# Patient Record
Sex: Male | Born: 1995 | Race: Black or African American | Hispanic: No | Marital: Single | Smoking: Never smoker
Health system: Southern US, Community
[De-identification: ages and names within clinical notes are randomized; demographics above are authoritative.]

---

## 2015-07-11 ENCOUNTER — Encounter (HOSPITAL_COMMUNITY): Payer: Self-pay | Admitting: Emergency Medicine

## 2015-07-11 ENCOUNTER — Emergency Department (HOSPITAL_COMMUNITY): Payer: Medicaid Other

## 2015-07-11 ENCOUNTER — Emergency Department (HOSPITAL_COMMUNITY)
Admission: EM | Admit: 2015-07-11 | Discharge: 2015-07-11 | Disposition: A | Discharge status: Home / Self Care | Payer: Medicaid Other | Attending: Emergency Medicine | Admitting: Emergency Medicine

## 2015-07-11 DIAGNOSIS — R079 Chest pain, unspecified: Secondary | ICD-10-CM | POA: Diagnosis present

## 2015-07-11 DIAGNOSIS — R002 Palpitations: Secondary | ICD-10-CM | POA: Diagnosis not present

## 2015-07-11 LAB — BASIC METABOLIC PANEL
Anion gap: 9 (ref 5–15)
BUN: 6 mg/dL (ref 6–20)
CO2: 24 mmol/L (ref 22–32)
Calcium: 9.5 mg/dL (ref 8.9–10.3)
Chloride: 104 mmol/L (ref 101–111)
Creatinine, Ser: 0.95 mg/dL (ref 0.61–1.24)
GFR calc Af Amer: >60 mL/min (ref >60)
GFR calc non Af Amer: >60 mL/min (ref >60)
Glucose, Bld: 107 mg/dL — ABNORMAL HIGH (ref 65–99)
Potassium: 4 mmol/L (ref 3.5–5.1)
SODIUM: 137 mmol/L (ref 135–145)

## 2015-07-11 LAB — CBC
HCT: 44.2 % (ref 39.0–52.0)
Hemoglobin: 15.4 g/dL (ref 13.0–17.0)
MCH: 29.8 pg (ref 26.0–34.0)
MCHC: 34.8 g/dL (ref 30.0–36.0)
MCV: 85.5 fL (ref 78.0–100.0)
PLATELETS: 164 10*3/uL (ref 150–400)
RBC: 5.17 MIL/uL (ref 4.22–5.81)
RDW: 12.9 % (ref 11.5–15.5)
WBC: 3.4 10*3/uL — AB (ref 4.0–10.5)

## 2015-07-11 LAB — I-STAT TROPONIN, ED: TROPONIN I, POC: 0 ng/mL (ref 0.00–0.08)

## 2015-07-11 MED ORDER — NAPROXEN 250 MG PO TABS: 250.0000 mg | ORAL_TABLET | Freq: Two times a day (BID) | ORAL | Status: DC | PRN

## 2015-07-11 NOTE — ED Notes (Signed)
Brought patient back to room; patient undressed, in gown, on monitor, continuous pulse oximetry and blood pressure cuff 

## 2015-07-11 NOTE — ED Provider Notes (Signed)
CSN: [id number]     Arrival date & time 07/11/15  1048 History   First MD Initiated Contact with Patient 07/11/15 1504     Chief Complaint  Patient presents with  . Chest Pain     (Consider location/radiation/quality/duration/timing/severity/associated sxs/prior Treatment) Patient is a 20 y.o. male presenting with palpitations. The history is provided by the patient. A language interpreter was used (swahili).  Palpitations Palpitations quality:  Fast Onset quality:  Sudden Duration:  6 minutes Timing:  Rare (once) Progression:  Resolved Chronicity:  New Context: dehydration   Relieved by: hydration. Worsened by:  Nothing Ineffective treatments:  None tried Associated symptoms: no chest pain, no chest pressure, no cough, no diaphoresis, no dizziness, no lower extremity edema, no nausea, no near-syncope, no shortness of breath and no syncope   Risk factors: no diabetes mellitus, no heart disease, no hx of atrial fibrillation, no hx of PE, no hypercoagulable state, no hyperthyroidism and no OTC sinus medications     History reviewed. No pertinent past medical history. History reviewed. No pertinent past surgical history. No family history on file. Social History  Substance Use Topics  . Smoking status: Never Smoker   . Smokeless tobacco: None  . Alcohol Use: Yes     Comment: socially    Review of Systems  Constitutional: Negative for chills, diaphoresis and appetite change.  HENT: Negative for congestion, ear pain, facial swelling, mouth sores and sore throat.   Eyes: Negative for visual disturbance.  Respiratory: Negative for cough, chest tightness and shortness of breath.   Cardiovascular: Positive for palpitations. Negative for chest pain, syncope and near-syncope.  Gastrointestinal: Negative for nausea, diarrhea and blood in stool.  Endocrine: Negative for cold intolerance and heat intolerance.  Genitourinary: Negative for frequency, decreased urine volume and difficulty  urinating.  Musculoskeletal: Negative for neck stiffness.  Skin: Negative for rash.  Neurological: Negative for dizziness and light-headedness.  All other systems reviewed and are negative.     Allergies  Review of patient's allergies indicates no known allergies.  Home Medications   Prior to Admission medications   Medication Sig Start Date End Date Taking? Authorizing Provider  naproxen (NAPROSYN) 250 MG tablet Take 1 tablet (250 mg total) by mouth 2 (two) times daily as needed for moderate pain. 07/11/15   Drema Pry, MD   BP 124/83 mmHg  Pulse 56  Temp(Src) 98.2 F (36.8 C) (Oral)  Resp 18  SpO2 100% Physical Exam  Constitutional: He is oriented to person, place, and time. He appears well-nourished. No distress.  HENT:  Head: Normocephalic and atraumatic.  Right Ear: External ear normal.  Left Ear: External ear normal.  Eyes: Pupils are equal, round, and reactive to light. Right eye exhibits no discharge. Left eye exhibits no discharge. No scleral icterus.  Neck: Normal range of motion. Neck supple.  Cardiovascular: Normal rate.  Exam reveals no gallop and no friction rub.   No murmur heard. Pulmonary/Chest: Effort normal and breath sounds normal. No stridor. No respiratory distress. He has no wheezes. He has no rales. He exhibits no tenderness.  Abdominal: Soft. He exhibits no distension and no mass. There is no tenderness. There is no rebound and no guarding.  Musculoskeletal: He exhibits no edema or tenderness.  Neurological: He is alert and oriented to person, place, and time.  Skin: Skin is warm and dry. No rash noted. He is not diaphoretic. No erythema.    ED Course  Procedures (including critical care time) Labs Review Labs Reviewed  BASIC METABOLIC PANEL - Abnormal; Notable for the following:    Glucose, Bld 107 (*)    All other components within normal limits  CBC - Abnormal; Notable for the following:    WBC 3.4 (*)    All other components within  normal limits  Rosezena Sensor, ED    Imaging Review Dg Chest 2 View  07/11/2015  CLINICAL DATA:  Chest pain since yesterday EXAM: CHEST  2 VIEW COMPARISON:  None. FINDINGS: The heart size and mediastinal contours are within normal limits. Both lungs are clear. The visualized skeletal structures are unremarkable. IMPRESSION: No active cardiopulmonary disease. Electronically Signed   By: Elige Ko   On: 07/11/2015 11:40   I have personally reviewed and evaluated these images and lab results as part of my medical decision-making.   EKG Interpretation   Date/Time:  Wednesday July 11 2015 11:01:14 EST Ventricular Rate:  53 PR Interval:  166 QRS Duration: 106 QT Interval:  382 QTC Calculation: 358 R Axis:   74 Text Interpretation:  Sinus bradycardia with sinus arrhythmia Otherwise  normal ECG No previous tracing Confirmed by Anitra Lauth  MD, Alphonzo Lemmings ([id number])  on 07/11/2015 3:06:05 PM      MDM   20 year old male who recently came to the [place] from [place] presents to the ED for work. Patient reports he had palpitations that lasted 6 minutes yesterday while at work and was seen by the work nurse who recommended seen a physician. She denied any chest pain, shortness of breat, dizziness, or nausea/vomiting during the episode. He denied any recent illnesses or infections. No personal or family history of cardiac problems.  No other physical complaints at this time. Rest of the history as above. EKG with sinus bradycardia and sinus arrhythmia. Normal intervals and axis. No evidence of Brugada. No ischemic changes. Normal variant for his age. Chest x-ray unremarkable. Labs unremarkable.   Given the patient's history this is likely secondary to dehydration hasn't resolved with fluids. No indication for additional workup at this time.  Patient did report that he tried calling his PCP however he does not understand and was fairly well. We spoke with our social worker who will assist the patient in  obtaining his first PCP appointment.  Patient is safe for discharge with strict return precautions. Patient is to follow-up with his PCP as needed.  Patient was seen in conjunction with Dr. Anitra Lauth.  Final diagnoses:  Palpitations        Drema Pry, MD 07/11/15 6045  Gwyneth Sprout, MD 07/12/15 [phone]

## 2015-07-11 NOTE — Care Management (Addendum)
ED CM consulted by Dr. Leonette Monarch to assist with establishing Primary Care. CM went to met with patient.  However patient was discharged from room. Patient speaks Swahili. CM contacted [employer] line spoke with Mliss Sax ID [phone] for interpreting, via conference call appointment was scheduled at the [facility] on Thurs 2/23 at [place]. Patient was agreeable. Address and phone number of clinic  verified in [place]. Consent verbally obtained to text clinic information in [place] he states,  he will will use [employer] to Translate. Sick note was also provided for this visit, patient stated via interpreter that he received a sick note prior to discharge. No further question or concerns noted.

## 2015-07-11 NOTE — Discharge Instructions (Signed)
Palpitations A palpitation is the feeling that your heartbeat is irregular. It may feel like your heart is fluttering or skipping a beat. It may also feel like your heart is beating faster than normal. This is usually not a serious problem. In some cases, you may need more medical tests. HOME CARE  Avoid:  Caffeine in coffee, tea, soft drinks, diet pills, and energy drinks.  Chocolate.  Alcohol.  Stop smoking if you smoke.  Reduce your stress and anxiety. Try:  A method that measures bodily functions so you can learn to control them (biofeedback).  Yoga.  Meditation.  Physical activity such as swimming, jogging, or walking.  Get plenty of rest and sleep. GET HELP IF:  Your fast or irregular heartbeat continues after 24 hours.  Your palpitations occur more often. GET HELP RIGHT AWAY IF:   You have chest pain.  You feel short of breath.  You have a very bad headache.  You feel dizzy or pass out (faint). MAKE SURE YOU:   Understand these instructions.  Will watch your condition.  Will get help right away if you are not doing well or get worse.   This information is not intended to replace advice given to you by your health care provider. Make sure you discuss any questions you have with your health care provider.   Document Released: 02/19/2008 Document Revised: 06/02/2014 Document Reviewed: 07/11/2011 Elsevier Interactive Patient Education 2016 Elsevier Inc.  

## 2015-07-11 NOTE — ED Notes (Signed)
Patient states started having chest pain yesterday, but has resolved at this time.   Patient states he has to have a note to return to work.  Patient states he went to primary doctor yesterday, but cannot tell me the name of the doctor.  Language barrier:  I used a Designer, jewellery.   The translator had a problem understanding patient.

## 2015-07-17 ENCOUNTER — Inpatient Hospital Stay: Payer: Medicaid Other | Admitting: Family Medicine

## 2015-07-19 ENCOUNTER — Ambulatory Visit: Payer: Medicaid Other | Attending: Family Medicine | Admitting: Family Medicine

## 2015-07-19 ENCOUNTER — Encounter: Payer: Self-pay | Admitting: Family Medicine

## 2015-07-19 VITALS — BP 118/72 | HR 50 | Temp 97.8°F | Resp 15 | Ht 66.5 in | Wt 170.0 lb

## 2015-07-19 DIAGNOSIS — R079 Chest pain, unspecified: Secondary | ICD-10-CM | POA: Insufficient documentation

## 2015-07-19 DIAGNOSIS — Z6825 Body mass index (BMI) 25.0-25.9, adult: Secondary | ICD-10-CM | POA: Insufficient documentation

## 2015-07-19 DIAGNOSIS — E663 Overweight: Secondary | ICD-10-CM | POA: Insufficient documentation

## 2015-07-19 DIAGNOSIS — Z131 Encounter for screening for diabetes mellitus: Secondary | ICD-10-CM | POA: Diagnosis not present

## 2015-07-19 DIAGNOSIS — M545 Low back pain, unspecified: Secondary | ICD-10-CM | POA: Insufficient documentation

## 2015-07-19 DIAGNOSIS — Z79899 Other long term (current) drug therapy: Secondary | ICD-10-CM | POA: Diagnosis not present

## 2015-07-19 LAB — POCT GLYCOSYLATED HEMOGLOBIN (HGB A1C): Hemoglobin A1C: 5.1

## 2015-07-19 MED ORDER — NAPROXEN 250 MG PO TABS: 250.0000 mg | ORAL_TABLET | Freq: Two times a day (BID) | ORAL | Status: DC | PRN

## 2015-07-19 NOTE — Patient Instructions (Signed)

## 2015-07-19 NOTE — Progress Notes (Signed)
Subjective:  Patient ID: Larry Rich, male    DOB: 09-05-1995  Age: 20 y.o. MRN: [id number]  CC: Follow-up   HPI Larry Rich presents for follow-up from the ED where he had presented with chest pains and palpitations. He was seen on 07/11/15 at [facility] ED; cardiac markers were negative, EKG , chest x-ray were unremarkable. Symptoms were thought to be secondary to dehydration and he was subsequently discharged to follow-up with the clinic here.  Since his ED visit he has not had any repeat symptoms however he does complain of occasional low back pain which he describes as moderate and is exacerbated by bending with a duration of 3 months and mostly occurs at his job where he has to do some lifting; pain is absent at this time and he has not used any OTC analgesics.  Outpatient Prescriptions Prior to Visit  Medication Sig Dispense Refill  . naproxen (NAPROSYN) 250 MG tablet Take 1 tablet (250 mg total) by mouth 2 (two) times daily as needed for moderate pain. (Patient not taking: Reported on 07/19/2015)     No facility-administered medications prior to visit.    ROS Review of Systems  Constitutional: Negative for activity change and appetite change.  HENT: Negative for sinus pressure and sore throat.   Eyes: Negative for visual disturbance.  Respiratory: Negative for cough, chest tightness and shortness of breath.   Cardiovascular: Negative for chest pain and leg swelling.  Gastrointestinal: Negative for abdominal pain, diarrhea, constipation and abdominal distention.  Endocrine: Negative.   Genitourinary: Negative for dysuria.  Musculoskeletal: Negative for myalgias and joint swelling.  Skin: Negative for rash.  Allergic/Immunologic: Negative.   Neurological: Negative for weakness, light-headedness and numbness.  Psychiatric/Behavioral: Negative for suicidal ideas and dysphoric mood.    Objective:  BP 118/72 mmHg  Pulse 50  Temp(Src) 97.8 F (36.6 C)  Resp 15   Ht 5' 6.5" (1.689 m)  Wt 170 lb (77.111 kg)  BMI 27.03 kg/m2  SpO2 98%  BP/Weight 07/19/2015 07/11/2015  Systolic BP 118 128  Diastolic BP 72 91  Wt. (Lbs) 170 -  BMI 27.03 -      Physical Exam  Constitutional: He is oriented to person, place, and time. He appears well-developed and well-nourished.  Cardiovascular: Normal heart sounds and intact distal pulses.  Bradycardia present.   No murmur heard. Pulmonary/Chest: Effort normal and breath sounds normal. He has no wheezes. He has no rales. He exhibits no tenderness.  Abdominal: Soft. Bowel sounds are normal. He exhibits no distension and no mass. There is no tenderness.  Musculoskeletal: Normal range of motion.  Neurological: He is alert and oriented to person, place, and time.  Skin: Skin is warm and dry.  Psychiatric: He has a normal mood and affect.     Assessment & Plan:   1. Diabetes mellitus screening A1c 5.1 - HgB A1c  2. Overweight (BMI 25.0-29.9) Advised to increase physical activity and reduce portion sizes  3. Midline low back pain without sciatica Demonstrated stretching exercises to the patient - naproxen (NAPROSYN) 250 MG tablet; Take 1 tablet (250 mg total) by mouth 2 (two) times daily as needed for moderate pain.  Dispense: 60 tablet; Refill: 1   Meds ordered this encounter  Medications  . naproxen (NAPROSYN) 250 MG tablet    Sig: Take 1 tablet (250 mg total) by mouth 2 (two) times daily as needed for moderate pain.    Dispense:  60 tablet    Refill:  1  Follow-up: Return in about 3 months (around 10/16/2015) for follow up of back pain.   Jaclyn Shaggy MD

## 2015-07-19 NOTE — Progress Notes (Signed)
Patient here to establish care and follow up from ED for palpitations He reports no complaints today and says symptoms have resolved

## 2016-02-13 ENCOUNTER — Ambulatory Visit: Payer: BLUE CROSS/BLUE SHIELD | Attending: Family Medicine | Admitting: Family Medicine

## 2016-02-13 ENCOUNTER — Encounter: Payer: Self-pay | Admitting: Family Medicine

## 2016-02-13 VITALS — BP 130/76 | HR 51 | Temp 97.7°F | Wt 160.6 lb

## 2016-02-13 DIAGNOSIS — M791 Myalgia, unspecified site: Secondary | ICD-10-CM

## 2016-02-13 DIAGNOSIS — Z114 Encounter for screening for human immunodeficiency virus [HIV]: Secondary | ICD-10-CM

## 2016-02-13 DIAGNOSIS — M545 Low back pain, unspecified: Secondary | ICD-10-CM

## 2016-02-13 LAB — HIV ANTIBODY (ROUTINE TESTING W REFLEX): HIV 1&2 Ab, 4th Generation: NONREACTIVE

## 2016-02-13 MED ORDER — NAPROXEN 250 MG PO TABS: 250.0000 mg | ORAL_TABLET | Freq: Two times a day (BID) | ORAL | 2 refills | Status: DC | PRN

## 2016-02-13 NOTE — Progress Notes (Signed)
Pt states he is here for the following:  Medication refill - Naproxen  Screening for HIV   Pt states he has been having fatigue and malaise for the past month. Pt states he has not been taking any multi vitamins and has been working a lot.

## 2016-02-13 NOTE — Progress Notes (Signed)
Subjective:  Patient ID: Larry Rich, male    DOB: [birth year]  Age: 20 y.o. MRN: [id number]  CC: Follow-up (General check up)   HPI Cristal Magid is a 27 -year-old Swahili speaking male who presents today accompanied by a Swahili interpreter and is requesting a refill of naproxen. He has body aches which are intermittent and low back pain which are related to his job. He works for [employer] and working hours are between 3 PM and 3 AM; his drive to and from work is a total of 3 hours.  Back pain does not radiate down his extremities and is relieved by taking naproxen; would like to be screened for HIV.  No past medical history on file.  No past surgical history on file.  No Known Allergies   Outpatient Medications Prior to Visit  Medication Sig Dispense Refill  . naproxen (NAPROSYN) 250 MG tablet Take 1 tablet (250 mg total) by mouth 2 (two) times daily as needed for moderate pain. (Patient not taking: Reported on 02/13/2016) 60 tablet 1   No facility-administered medications prior to visit.     ROS Review of Systems  Constitutional: Positive for fatigue. Negative for activity change and appetite change.  HENT: Negative for sinus pressure and sore throat.   Eyes: Negative for visual disturbance.  Respiratory: Negative for cough, chest tightness and shortness of breath.   Cardiovascular: Negative for chest pain and leg swelling.  Gastrointestinal: Negative for abdominal distention, abdominal pain, constipation and diarrhea.  Endocrine: Negative.   Genitourinary: Negative for dysuria.  Musculoskeletal: Positive for back pain and myalgias. Negative for joint swelling.  Skin: Negative for rash.  Allergic/Immunologic: Negative.   Neurological: Negative for weakness, light-headedness and numbness.  Psychiatric/Behavioral: Negative for dysphoric mood and suicidal ideas.    Objective:  BP 130/76 (BP Location: Right Arm, Patient Position: Sitting, Cuff Size: Large)   Pulse  (!) 51   Temp 97.7 F (36.5 C) (Oral)   Wt 160 lb 9.6 oz (72.8 kg)   BMI 25.53 kg/m   BP/Weight 02/13/2016 07/19/2015 07/11/2015  Systolic BP 130 118 128  Diastolic BP 76 72 91  Wt. (Lbs) 160.6 170 -  BMI 25.53 27.03 -      Physical Exam  Constitutional: He is oriented to person, place, and time. He appears well-developed and well-nourished.  Cardiovascular: Normal rate, normal heart sounds and intact distal pulses.   No murmur heard. Pulmonary/Chest: Effort normal and breath sounds normal. He has no wheezes. He has no rales. He exhibits no tenderness.  Abdominal: Soft. Bowel sounds are normal. He exhibits no distension and no mass. There is no tenderness.  Musculoskeletal: Normal range of motion.  Neurological: He is alert and oriented to person, place, and time.     Assessment & Plan:   1. Encounter for special screening examination for HIV - HIV antibody (with reflex)  2. Midline low back pain without sciatica Advised on proper lifting techniques - naproxen (NAPROSYN) 250 MG tablet; Take 1 tablet (250 mg total) by mouth 2 (two) times daily as needed for moderate pain.  Dispense: 60 tablet; Refill: 2  3. 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 naproxen (NAPROSYN) 250 MG tablet    Sig: Take 1 tablet (250 mg total) by mouth 2 (two) times daily as needed for moderate pain.    Dispense:  60 tablet  Refill:  2    Follow-up: Return in about 6 months (around 08/12/2016) for coordination of care.   Jaclyn ShaggyEnobong Amao MD

## 2016-02-14 ENCOUNTER — Telehealth: Payer: Self-pay

## 2016-02-14 NOTE — Telephone Encounter (Signed)
-----   Message from Jaclyn ShaggyEnobong Amao, MD sent at 02/14/2016  9:12 AM EDT ----- Please inform the patient that labs are normal. Thank you.

## 2016-02-14 NOTE — Telephone Encounter (Signed)
Clld pt  Used [employer] [place] ID# [id number]  Advsd pt of results. Pt stated he understood.

## 2016-11-07 ENCOUNTER — Encounter (HOSPITAL_COMMUNITY): Payer: Self-pay | Admitting: Nurse Practitioner

## 2016-11-07 ENCOUNTER — Emergency Department (HOSPITAL_COMMUNITY)
Admission: EM | Admit: 2016-11-07 | Discharge: 2016-11-07 | Disposition: A | Discharge status: Home / Self Care | Payer: BLUE CROSS/BLUE SHIELD | Attending: Emergency Medicine | Admitting: Emergency Medicine

## 2016-11-07 DIAGNOSIS — I10 Essential (primary) hypertension: Secondary | ICD-10-CM | POA: Diagnosis present

## 2016-11-07 DIAGNOSIS — Z0130 Encounter for examination of blood pressure without abnormal findings: Secondary | ICD-10-CM

## 2016-11-07 NOTE — ED Provider Notes (Signed)
[facility]-EMERGENCY DEPT Provider Note   CSN: [id number] Arrival date & time: 11/07/16  1624  By signing my name below, I, Modena JanskyAlbert Thayil, attest that this documentation has been prepared under the direction and in the presence of non-physician practitioner, Kerrie BuffaloHope Fronie Holstein, NP. Electronically Signed: Modena JanskyAlbert Thayil, Scribe. 11/07/2016. 5:51 PM.  History   Chief Complaint Chief Complaint  Patient presents with  . Hypertension   The history is provided by the patient. No language interpreter was used.  Hypertension  This is a new problem. The current episode started yesterday. The problem occurs daily. The problem has not changed since onset.Pertinent negatives include no chest pain, no abdominal pain and no shortness of breath. Nothing aggravates the symptoms. Nothing relieves the symptoms. He has tried nothing for the symptoms.   HPI Comments: Larry Rich is a 21 y.o. male who presents to the Emergency Department complaining of hypertension that started yesterday. He states he had a high blood pressure reading  at his work place and instructed to get a note clearing him to resume his job. His BP in the ED today was 150/89. No modifying factors or associated symptoms. Denies any known prior hx of HTN or other complaints at this time. Patient reports that he went to his PCP office but he is no longer there. Patient has to have a note to return to work so he came to the ED.   History reviewed. No pertinent past medical history.  Patient Active Problem List   Diagnosis Date Noted  . Myalgia 02/13/2016  . Overweight (BMI 25.0-29.9) 07/19/2015  . Low back pain 07/19/2015    History reviewed. No pertinent surgical history.     Home Medications    Prior to Admission medications   Not on File    Family History History reviewed. No pertinent family history.  Social History Social History  Substance Use Topics  . Smoking status: Never Smoker  . Smokeless tobacco: Never Used  . Alcohol  use 1.8 oz/week    3 Cans of beer per week     Comment: socially     Allergies   Patient has no known allergies.   Review of Systems Review of Systems  Constitutional: Negative for fever.  HENT: Negative.   Eyes: Negative for visual disturbance.  Respiratory: Negative for shortness of breath.   Cardiovascular: Negative for chest pain.       +HTN  Gastrointestinal: Negative for abdominal pain.  Musculoskeletal: Negative for arthralgias.  Skin: Negative for rash.     Physical Exam Updated Vital Signs BP (!) 150/89   Pulse 79   Temp 97.8 F (36.6 C) (Oral)   Resp 18   SpO2 100%    Physical Exam  Constitutional: He appears well-developed and well-nourished. No distress.  HENT:  Head: Normocephalic and atraumatic.  Eyes: EOM are normal.  Neck: Neck supple.  Cardiovascular: Normal rate and regular rhythm.   Pulmonary/Chest: Effort normal. No respiratory distress. He has no wheezes. He has no rales.  Musculoskeletal: Normal range of motion.  Neurological: He is alert.  Skin: Skin is warm and dry.  Psychiatric: He has a normal mood and affect.  Nursing note and vitals reviewed.    ED Treatments / Results  DIAGNOSTIC STUDIES: Oxygen Saturation is 100% on RA, normal by my interpretation.    COORDINATION OF CARE: 5:55 PM- Pt advised of plan for treatment and pt agrees.  Labs (all labs ordered are listed, but only abnormal results are displayed) Labs Reviewed - No  data to display   Radiology No results found.  Procedures Procedures (including critical care time)  Medications Ordered in ED Medications - No data to display   Initial Impression / Assessment and Plan / ED Course  I have reviewed the triage vital signs and the nursing notes. Patient here for work note due to being hypertensive at work.  No signs of hypertensive urgency.  Discussed with patient the need for close follow-up and management by a primary care physician. Case Manager in to discuss  with the patient f/u. [facility] for follow up of his BP. Blood pressure stable at d/c. Will not start medication.  BP 132/86 (BP Location: Right Arm)   Pulse 65   Temp 97.8 F (36.6 C) (Oral)   Resp 18   SpO2 100%   Final Clinical Impressions(s) / ED Diagnoses   Final diagnoses:  Blood pressure check    New Prescriptions Discharge Medication List as of 11/07/2016  8:08 PM    I personally performed the services described in this documentation, which was scribed in my presence. The recorded information has been reviewed and is accurate.    Kerrie Buffalo [place], [place] 11/08/16 0147    Nira Conn, MD 11/08/16 [phone]

## 2016-11-07 NOTE — ED Triage Notes (Signed)
Pt presents with c/o high blood pressure. He started new job yesterday and they checked his blood pressure and found it was elevated. He was told he can not return to work without a doctors note regarding his blood pressure. He denies any complaints. He denies any history of hypertension. He speaks french/swahili.

## 2016-11-07 NOTE — Care Management (Signed)
ED CM met with patient at bedside to discuss ED folllow up offered patient to contact a [facility] provider, patient prefers a [facility] provider. Discussed [facility] for primary care needs. CM provided contact information for patient to call Monday 6/18 after 9am to schedule follow up appointment, verbalized understanding teach back done. No further ED CM needs identified

## 2016-11-07 NOTE — Discharge Instructions (Signed)
Follow up with [facility], not because you have [facility] but for your blood pressure monitoring.

## 2016-12-27 ENCOUNTER — Emergency Department (HOSPITAL_COMMUNITY)
Admission: EM | Admit: 2016-12-27 | Discharge: 2016-12-27 | Disposition: A | Discharge status: Home / Self Care | Payer: BLUE CROSS/BLUE SHIELD | Attending: Emergency Medicine | Admitting: Emergency Medicine

## 2016-12-27 ENCOUNTER — Emergency Department (HOSPITAL_COMMUNITY): Payer: BLUE CROSS/BLUE SHIELD

## 2016-12-27 ENCOUNTER — Encounter (HOSPITAL_COMMUNITY): Payer: Self-pay | Admitting: Emergency Medicine

## 2016-12-27 DIAGNOSIS — S51812A Laceration without foreign body of left forearm, initial encounter: Secondary | ICD-10-CM | POA: Diagnosis not present

## 2016-12-27 DIAGNOSIS — Y939 Activity, unspecified: Secondary | ICD-10-CM | POA: Insufficient documentation

## 2016-12-27 DIAGNOSIS — Y929 Unspecified place or not applicable: Secondary | ICD-10-CM | POA: Insufficient documentation

## 2016-12-27 DIAGNOSIS — Z23 Encounter for immunization: Secondary | ICD-10-CM | POA: Diagnosis not present

## 2016-12-27 DIAGNOSIS — Y999 Unspecified external cause status: Secondary | ICD-10-CM | POA: Diagnosis not present

## 2016-12-27 DIAGNOSIS — S0990XA Unspecified injury of head, initial encounter: Secondary | ICD-10-CM | POA: Diagnosis present

## 2016-12-27 MED ORDER — MUPIROCIN 2 % EX OINT: TOPICAL_OINTMENT | CUTANEOUS | 0 refills | Status: AC

## 2016-12-27 MED ORDER — TETANUS-DIPHTH-ACELL PERTUSSIS 5-2.5-18.5 LF-MCG/0.5 IM SUSP
0.5000 mL | Freq: Once | INTRAMUSCULAR | Status: AC
  Administered 2016-12-27: 0.5 mL via INTRAMUSCULAR
  Filled 2016-12-27: qty 0.5

## 2016-12-27 MED ORDER — LIDOCAINE-EPINEPHRINE (PF) 2 %-1:200000 IJ SOLN
20.0000 mL | Freq: Once | INTRAMUSCULAR | Status: AC
  Administered 2016-12-27: 20 mL
  Filled 2016-12-27: qty 20

## 2016-12-27 MED ORDER — OXYCODONE-ACETAMINOPHEN 5-325 MG PO TABS: 1.0000 | ORAL_TABLET | Freq: Once | ORAL | Status: DC

## 2016-12-27 NOTE — ED Notes (Signed)
PA at bedside suturing wound.
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
## 2016-12-27 NOTE — ED Notes (Signed)
Pt is difficult to arouse. Snoring respirations. Requires continuous stimulation to arouse. Is 96% RA. PA made aware.
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
## 2016-12-27 NOTE — Discharge Instructions (Signed)
It was my pleasure taking care of you today!   Keep wounds clean with mild soap and water. Keep area covered with a topical antibiotic ointment and bandage, keep bandage dry, and do not submerge in water for 24 hours. Ice and elevate for additional pain relief and swelling. Alternate between ibuprofen and Tylenol for additional pain relief. [facility] or ER in approximately 10 days for wound recheck and suture removal. Monitor area for signs of infection to include, but not limited to: increasing pain, spreading redness, drainage/pus, worsening swelling, or fevers. Return to emergency department for emergent changing or worsening symptoms.   WOUND CARE Keep area clean and dry for 24 hours. Do not remove bandage, if applied. After 24 hours,you should change it at least once a day. Also, change the dressing if it becomes wet or dirty, or as directed by your caregiver.  Wash the wound with soap and water 2 times a day. Rinse the wound off with water to remove all soap. Pat the wound dry with a clean towel.  You may shower as usual after the first 24 hours. Do not soak the wound in water until the sutures are removed.  Return if you experience any of the following signs of infection: Swelling, redness, pus drainage, streaking, fever >101.0  Return if you experience excessive bleeding that does not stop after 15-20 minutes of constant, firm pressure.
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
## 2021-10-22 ENCOUNTER — Emergency Department (HOSPITAL_COMMUNITY)
Admission: EM | Admit: 2021-10-22 | Discharge: 2021-10-23 | Disposition: A | Discharge status: Home / Self Care | Payer: BC Managed Care – PPO | Attending: Student | Admitting: Student

## 2021-10-22 ENCOUNTER — Encounter (HOSPITAL_COMMUNITY): Payer: Self-pay

## 2021-10-22 ENCOUNTER — Other Ambulatory Visit: Payer: Self-pay

## 2021-10-22 DIAGNOSIS — R000 Tachycardia, unspecified: Secondary | ICD-10-CM | POA: Insufficient documentation

## 2021-10-22 DIAGNOSIS — F10920 Alcohol use, unspecified with intoxication, uncomplicated: Secondary | ICD-10-CM

## 2021-10-22 DIAGNOSIS — Y908 Blood alcohol level of 240 mg/100 ml or more: Secondary | ICD-10-CM | POA: Insufficient documentation

## 2021-10-22 DIAGNOSIS — F10120 Alcohol abuse with intoxication, uncomplicated: Secondary | ICD-10-CM | POA: Diagnosis present
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
## 2021-10-23 LAB — RAPID URINE DRUG SCREEN, HOSP PERFORMED
Amphetamines: NOT DETECTED
Barbiturates: NOT DETECTED
Benzodiazepines: NOT DETECTED
Cocaine: NOT DETECTED
Opiates: NOT DETECTED
Tetrahydrocannabinol: NOT DETECTED

## 2021-10-23 LAB — CBC WITH DIFFERENTIAL/PLATELET
Abs Immature Granulocytes: 0.03 10*3/uL (ref 0.00–0.07)
Basophils Absolute: 0 10*3/uL (ref 0.0–0.1)
Basophils Relative: 1 %
Eosinophils Absolute: 0 10*3/uL (ref 0.0–0.5)
Eosinophils Relative: 0 %
HCT: 47.1 % (ref 39.0–52.0)
Hemoglobin: 16.3 g/dL (ref 13.0–17.0)
Immature Granulocytes: 1 %
Lymphocytes Relative: 52 %
Lymphs Abs: 2.3 10*3/uL (ref 0.7–4.0)
MCH: 29.5 pg (ref 26.0–34.0)
MCHC: 34.6 g/dL (ref 30.0–36.0)
MCV: 85.3 fL (ref 80.0–100.0)
Monocytes Absolute: 0.3 10*3/uL (ref 0.1–1.0)
Monocytes Relative: 6 %
Neutro Abs: 1.8 10*3/uL (ref 1.7–7.7)
Neutrophils Relative %: 40 %
Platelets: 244 10*3/uL (ref 150–400)
RBC: 5.52 MIL/uL (ref 4.22–5.81)
RDW: 12.7 % (ref 11.5–15.5)
WBC: 4.4 10*3/uL (ref 4.0–10.5)
nRBC: 0 % (ref 0.0–0.2)

## 2021-10-23 LAB — COMPREHENSIVE METABOLIC PANEL
ALT: 27 U/L (ref 0–44)
AST: 30 U/L (ref 15–41)
Albumin: 5 g/dL (ref 3.5–5.0)
Alkaline Phosphatase: 81 U/L (ref 38–126)
Anion gap: 9 (ref 5–15)
BUN: 11 mg/dL (ref 6–20)
CO2: 21 mmol/L — ABNORMAL LOW (ref 22–32)
Calcium: 8.8 mg/dL — ABNORMAL LOW (ref 8.9–10.3)
Chloride: 113 mmol/L — ABNORMAL HIGH (ref 98–111)
Creatinine, Ser: 1.16 mg/dL (ref 0.61–1.24)
GFR, Estimated: >60 mL/min (ref >60)
Glucose, Bld: 144 mg/dL — ABNORMAL HIGH (ref 70–99)
Potassium: 4.3 mmol/L (ref 3.5–5.1)
Sodium: 143 mmol/L (ref 135–145)
Total Bilirubin: 0.6 mg/dL (ref 0.3–1.2)
Total Protein: 8.7 g/dL — ABNORMAL HIGH (ref 6.5–8.1)

## 2021-10-23 LAB — SALICYLATE LEVEL: Salicylate Lvl: <7 mg/dL — ABNORMAL LOW (ref 7.0–30.0)

## 2021-10-23 LAB — ETHANOL: Alcohol, Ethyl (B): 491 mg/dL (ref <10)

## 2021-10-23 LAB — ACETAMINOPHEN LEVEL: Acetaminophen (Tylenol), Serum: <10 ug/mL — ABNORMAL LOW (ref 10–30)

## 2021-10-23 MED ORDER — LACTATED RINGERS IV BOLUS
1000.0000 mL | Freq: Once | INTRAVENOUS | Status: AC
  Administered 2021-10-23: 1000 mL via INTRAVENOUS

## 2021-10-23 MED ORDER — THIAMINE HCL 100 MG/ML IJ SOLN
100.0000 mg | Freq: Every day | INTRAMUSCULAR | Status: DC
  Administered 2021-10-23: 100 mg via INTRAVENOUS
  Filled 2021-10-23: qty 2
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
## 2021-10-23 NOTE — ED Notes (Addendum)
Patient able to ambulate to bathroom with this RN at standby. Pt's gait unsteady, but able to ambulate without assistance.
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
## 2022-06-26 DEATH — deceased
